# Patient Record
Sex: Male | Born: 1955 | Race: Black or African American | Hispanic: No | Marital: Single | Smoking: Never smoker
Health system: Southern US, Community
[De-identification: ages and names within clinical notes are randomized; demographics above are authoritative.]

## PROBLEM LIST (undated history)

## (undated) DIAGNOSIS — E119 Type 2 diabetes mellitus without complications: Secondary | ICD-10-CM

## (undated) DIAGNOSIS — I10 Essential (primary) hypertension: Secondary | ICD-10-CM

---

## 2015-07-29 ENCOUNTER — Encounter (HOSPITAL_BASED_OUTPATIENT_CLINIC_OR_DEPARTMENT_OTHER): Payer: Self-pay

## 2015-07-29 ENCOUNTER — Emergency Department (HOSPITAL_BASED_OUTPATIENT_CLINIC_OR_DEPARTMENT_OTHER): Payer: Medicare Other

## 2015-07-29 ENCOUNTER — Emergency Department (HOSPITAL_BASED_OUTPATIENT_CLINIC_OR_DEPARTMENT_OTHER)
Admission: EM | Admit: 2015-07-29 | Discharge: 2015-07-29 | Disposition: A | Discharge status: Home / Self Care | Payer: Medicare Other | Attending: Emergency Medicine | Admitting: Emergency Medicine

## 2015-07-29 DIAGNOSIS — R112 Nausea with vomiting, unspecified: Secondary | ICD-10-CM | POA: Diagnosis not present

## 2015-07-29 DIAGNOSIS — R1084 Generalized abdominal pain: Secondary | ICD-10-CM | POA: Diagnosis not present

## 2015-07-29 DIAGNOSIS — Z7982 Long term (current) use of aspirin: Secondary | ICD-10-CM | POA: Insufficient documentation

## 2015-07-29 DIAGNOSIS — Z79899 Other long term (current) drug therapy: Secondary | ICD-10-CM | POA: Diagnosis not present

## 2015-07-29 DIAGNOSIS — E119 Type 2 diabetes mellitus without complications: Secondary | ICD-10-CM | POA: Diagnosis not present

## 2015-07-29 DIAGNOSIS — R109 Unspecified abdominal pain: Secondary | ICD-10-CM | POA: Diagnosis present

## 2015-07-29 DIAGNOSIS — Z7902 Long term (current) use of antithrombotics/antiplatelets: Secondary | ICD-10-CM | POA: Insufficient documentation

## 2015-07-29 DIAGNOSIS — Z9889 Other specified postprocedural states: Secondary | ICD-10-CM | POA: Diagnosis not present

## 2015-07-29 DIAGNOSIS — Z7984 Long term (current) use of oral hypoglycemic drugs: Secondary | ICD-10-CM | POA: Insufficient documentation

## 2015-07-29 DIAGNOSIS — I10 Essential (primary) hypertension: Secondary | ICD-10-CM | POA: Insufficient documentation

## 2015-07-29 HISTORY — DX: Essential (primary) hypertension: I10

## 2015-07-29 HISTORY — DX: Type 2 diabetes mellitus without complications: E11.9

## 2015-07-29 LAB — CBC WITH DIFFERENTIAL/PLATELET
BASOS ABS: 0 10*3/uL (ref 0.0–0.1)
Basophils Relative: 0 %
EOS ABS: 0.2 10*3/uL (ref 0.0–0.7)
Eosinophils Relative: 3 %
HEMATOCRIT: 40.3 % (ref 39.0–52.0)
HEMOGLOBIN: 13.7 g/dL (ref 13.0–17.0)
LYMPHS PCT: 28 %
Lymphs Abs: 1.7 10*3/uL (ref 0.7–4.0)
MCH: 24.3 pg — ABNORMAL LOW (ref 26.0–34.0)
MCHC: 34 g/dL (ref 30.0–36.0)
MCV: 71.6 fL — ABNORMAL LOW (ref 78.0–100.0)
MONOS PCT: 8 %
Monocytes Absolute: 0.5 10*3/uL (ref 0.1–1.0)
NEUTROS ABS: 3.7 10*3/uL (ref 1.7–7.7)
NEUTROS PCT: 61 %
Platelets: 170 10*3/uL (ref 150–400)
RBC: 5.63 MIL/uL (ref 4.22–5.81)
RDW: 15.1 % (ref 11.5–15.5)
WBC: 6.1 10*3/uL (ref 4.0–10.5)

## 2015-07-29 LAB — COMPREHENSIVE METABOLIC PANEL
ALBUMIN: 4.2 g/dL (ref 3.5–5.0)
ALK PHOS: 38 U/L (ref 38–126)
ALT: 20 U/L (ref 17–63)
ANION GAP: 10 (ref 5–15)
AST: 21 U/L (ref 15–41)
BILIRUBIN TOTAL: 0.6 mg/dL (ref 0.3–1.2)
BUN: 22 mg/dL — ABNORMAL HIGH (ref 6–20)
CALCIUM: 9 mg/dL (ref 8.9–10.3)
CO2: 26 mmol/L (ref 22–32)
Chloride: 98 mmol/L — ABNORMAL LOW (ref 101–111)
Creatinine, Ser: 1.19 mg/dL (ref 0.61–1.24)
GFR calc non Af Amer: >60 mL/min (ref >60)
GLUCOSE: 320 mg/dL — AB (ref 65–99)
POTASSIUM: 4.6 mmol/L (ref 3.5–5.1)
Sodium: 134 mmol/L — ABNORMAL LOW (ref 135–145)
TOTAL PROTEIN: 8 g/dL (ref 6.5–8.1)

## 2015-07-29 LAB — URINE MICROSCOPIC-ADD ON

## 2015-07-29 LAB — URINALYSIS, ROUTINE W REFLEX MICROSCOPIC
BILIRUBIN URINE: NEGATIVE
Glucose, UA: >1000 mg/dL — AB
KETONES UR: NEGATIVE mg/dL
Leukocytes, UA: NEGATIVE
NITRITE: NEGATIVE
PH: 5.5 (ref 5.0–8.0)
Protein, ur: 30 mg/dL — AB
Specific Gravity, Urine: 1.025 (ref 1.005–1.030)

## 2015-07-29 LAB — LIPASE, BLOOD: LIPASE: 26 U/L (ref 11–51)

## 2015-07-29 LAB — CBG MONITORING, ED: GLUCOSE-CAPILLARY: 309 mg/dL — AB (ref 65–99)

## 2015-07-29 MED ORDER — IOHEXOL 300 MG/ML  SOLN
100.0000 mL | Freq: Once | INTRAMUSCULAR | Status: AC | PRN
  Administered 2015-07-29: 100 mL via INTRAVENOUS

## 2015-07-29 MED ORDER — ONDANSETRON 4 MG PO TBDP: ORAL_TABLET | ORAL | Status: DC

## 2015-07-29 MED ORDER — IOHEXOL 300 MG/ML  SOLN
25.0000 mL | Freq: Once | INTRAMUSCULAR | Status: AC | PRN
  Administered 2015-07-29: 25 mL via ORAL

## 2015-07-29 MED ORDER — SODIUM CHLORIDE 0.9 % IV BOLUS (SEPSIS)
500.0000 mL | Freq: Once | INTRAVENOUS | Status: AC
  Administered 2015-07-29: 500 mL via INTRAVENOUS

## 2015-07-29 MED ORDER — ONDANSETRON HCL 4 MG/2ML IJ SOLN
4.0000 mg | Freq: Once | INTRAMUSCULAR | Status: AC
  Administered 2015-07-29: 4 mg via INTRAVENOUS
  Filled 2015-07-29: qty 2

## 2015-07-29 MED ORDER — MORPHINE SULFATE (PF) 4 MG/ML IV SOLN
4.0000 mg | Freq: Once | INTRAVENOUS | Status: AC
  Administered 2015-07-29: 4 mg via INTRAVENOUS
  Filled 2015-07-29: qty 1

## 2015-07-29 NOTE — ED Notes (Signed)
Patient here with abdominal pain and vomiting that started lat pm, denies diarrhea. Reports general abdominal pain

## 2015-07-29 NOTE — ED Provider Notes (Signed)
CSN: [id number]     Arrival date & time 07/29/15  1018 History   First MD Initiated Contact with Patient 07/29/15 1030     Chief Complaint  Patient presents with  . Emesis     (Consider location/radiation/quality/duration/timing/severity/associated sxs/prior Treatment) HPI Comments: Patient presents with abdominal pain and vomiting. He has a history of diabetes and hypertension. He states that last night he started having pain to his midabdomen. Shortly after that he started having vomiting. He's had multiple episodes of nonbloody, nonbilious vomiting. He denies any change in bowel habits. His last bowel movement was 2 days ago which she states is normal for him. He denies any urinary symptoms. He denies any fevers or chills. He states the pain is been constant since it started. He's had a couple episodes in the past of similar pain which she states resolved without treatment. He has had an abdominal surgery but he doesn't know what the surgery was performed for. He states it was done when he had a pain similar to what he is having now. However he's not sure exactly what they did. He states he still has his gallbladder appendix and denies any colectomy.  Patient is a 60 y.o. male presenting with vomiting.  Emesis Associated symptoms: abdominal pain   Associated symptoms: no arthralgias, no chills, no diarrhea and no headaches     Past Medical History  Diagnosis Date  . Diabetes mellitus without complication (HCC)   . Hypertension    History reviewed. No pertinent past surgical history. No family history on file. Social History  Substance Use Topics  . Smoking status: Never Smoker   . Smokeless tobacco: None  . Alcohol Use: None    Review of Systems  Constitutional: Negative for fever, chills, diaphoresis and fatigue.  HENT: Negative for congestion, rhinorrhea and sneezing.   Eyes: Negative.   Respiratory: Negative for cough, chest tightness and shortness of breath.    Cardiovascular: Negative for chest pain and leg swelling.  Gastrointestinal: Positive for nausea, vomiting and abdominal pain. Negative for diarrhea and blood in stool.  Genitourinary: Negative for frequency, hematuria, flank pain and difficulty urinating.  Musculoskeletal: Negative for back pain and arthralgias.  Skin: Negative for rash.  Neurological: Negative for dizziness, speech difficulty, weakness, numbness and headaches.      Allergies  Review of patient's allergies indicates no known allergies.  Home Medications   Prior to Admission medications   Medication Sig Start Date End Date Taking? Authorizing Provider  aspirin EC 81 MG tablet Take 81 mg by mouth daily.   Yes Historical Provider, MD  carvedilol (COREG) 12.5 MG tablet Take 12.5 mg by mouth 2 (two) times daily with a meal.   Yes Historical Provider, MD  clopidogrel (PLAVIX) 75 MG tablet Take 75 mg by mouth daily.   Yes Historical Provider, MD  gabapentin (NEURONTIN) 300 MG capsule Take 300 mg by mouth 3 (three) times daily.   Yes Historical Provider, MD  hydrochlorothiazide (HYDRODIURIL) 25 MG tablet Take 25 mg by mouth daily.   Yes Historical Provider, MD  lisinopril (PRINIVIL,ZESTRIL) 40 MG tablet Take 40 mg by mouth daily.   Yes Historical Provider, MD  metFORMIN (GLUCOPHAGE) 1000 MG tablet Take 1,000 mg by mouth 2 (two) times daily with a meal.   Yes Historical Provider, MD  ondansetron (ZOFRAN ODT) 4 MG disintegrating tablet 4mg  ODT q4 hours prn nausea/vomit 07/29/15   Rolan Bucco, MD  pregabalin (LYRICA) 75 MG capsule Take 75 mg by mouth 2 (two)  times daily.   Yes Historical Provider, MD   BP 112/80 mmHg  Pulse 91  Temp(Src) 98.5 F (36.9 C) (Oral)  Resp 20  Ht  (1.676 m)  Wt 205 lb (92.987 kg)  BMI 33.10 kg/m2  SpO2 100% Physical Exam  Constitutional: He is oriented to person, place, and time. He appears well-developed and well-nourished.  HENT:  Head: Normocephalic and atraumatic.  Eyes: Pupils  are equal, round, and reactive to light.  Neck: Normal range of motion. Neck supple.  Cardiovascular: Normal rate, regular rhythm and normal heart sounds.   Pulmonary/Chest: Effort normal and breath sounds normal. No respiratory distress. He has no wheezes. He has no rales. He exhibits no tenderness.  Abdominal: Soft. Bowel sounds are normal. Tenderness: moderate TTP to periumbilical area.  midline vertical scar. There is no rebound and no guarding.  No definite hernia palpated  Musculoskeletal: Normal range of motion. He exhibits no edema.  Lymphadenopathy:    He has no cervical adenopathy.  Neurological: He is alert and oriented to person, place, and time.  Skin: Skin is warm and dry. No rash noted.  Psychiatric: He has a normal mood and affect.    ED Course  Procedures (including critical care time) Labs Review Labs Reviewed  URINALYSIS, ROUTINE W REFLEX MICROSCOPIC (NOT AT [facility]) - Abnormal; Notable for the following:    Glucose, UA >1000 (*)    Hgb urine dipstick TRACE (*)    Protein, ur 30 (*)    All other components within normal limits  COMPREHENSIVE METABOLIC PANEL - Abnormal; Notable for the following:    Sodium 134 (*)    Chloride 98 (*)    Glucose, Bld 320 (*)    BUN 22 (*)    All other components within normal limits  CBC WITH DIFFERENTIAL/PLATELET - Abnormal; Notable for the following:    MCV 71.6 (*)    MCH 24.3 (*)    All other components within normal limits  URINE MICROSCOPIC-ADD ON - Abnormal; Notable for the following:    Squamous Epithelial / LPF 0-5 (*)    Bacteria, UA RARE (*)    Casts HYALINE CASTS (*)    All other components within normal limits  CBG MONITORING, ED - Abnormal; Notable for the following:    Glucose-Capillary 309 (*)    All other components within normal limits  LIPASE, BLOOD    Imaging Review Ct Abdomen Pelvis W Contrast  07/29/2015  CLINICAL DATA:  Midline abdominal pain, vomiting for 2 days. EXAM: CT ABDOMEN AND PELVIS WITH  CONTRAST TECHNIQUE: Multidetector CT imaging of the abdomen and pelvis was performed using the standard protocol following bolus administration of intravenous contrast. CONTRAST:  OMNIPAQUE IOHEXOL 300 MG/ML SOLN, 25mL OMNIPAQUE IOHEXOL 300 MG/ML SOLN COMPARISON:  None. FINDINGS: Heart is borderline enlarged. No confluent opacities in the lung bases. No effusions. Liver, spleen, pancreas, adrenals are unremarkable. Small cysts in the left kidney. Right kidney unremarkable. No hydronephrosis. There is mild wall thickening within left abdominal small bowel loops. This is concerning for enteritis. No evidence of bowel obstruction. Free fluid noted around the liver and in the cul-de-sac. Prior hysterectomy. No adnexal masses. Urinary bladder unremarkable. Small bilateral inguinal and umbilical hernias. Appendix is visualized and is normal. There is fluid adjacent to the appendix in the right abdomen/ paracolic gutter. No acute bony abnormality or focal bone lesion. IMPRESSION: Mild wall thickening within left abdominal small bowel loops concerning for enteritis. Associated mild ascites around the liver and  in the cul-de-sac. Electronically Signed   By: Charlett Nose M.D.   On: 07/29/2015 13:35   I have personally reviewed and evaluated these images and lab results as part of my medical decision-making.   EKG Interpretation None      MDM   Final diagnoses:  Generalized abdominal pain    Patient presents with periumbilical pain and vomiting. He was given a dose of morphine and Zofran as well as IV fluids. His CT scan shows some signs of mild enteritis of the small bowel. There is no evidence of obstruction. No evidence of incarcerated hernias. Patient feels 100% better. He has absolutely no abdominal tenderness on exam. He's tolerating by mouth fluids. He was discharged home in good condition. He was advised to use a clear liquid diet for the next 24 hours. Is advised to return here for symptoms  worsen. He was also advised his blood sugar was elevated and that he needs to keep an eye on this at home.    Rolan Bucco, MD 07/29/15 [phone]

## 2015-07-29 NOTE — Discharge Instructions (Signed)

## 2015-07-29 NOTE — ED Notes (Signed)
Dr Belfi in room now. 

## 2015-08-02 ENCOUNTER — Emergency Department (HOSPITAL_BASED_OUTPATIENT_CLINIC_OR_DEPARTMENT_OTHER)
Admission: EM | Admit: 2015-08-02 | Discharge: 2015-08-02 | Disposition: A | Discharge status: Home / Self Care | Payer: Medicare Other | Attending: Emergency Medicine | Admitting: Emergency Medicine

## 2015-08-02 ENCOUNTER — Encounter (HOSPITAL_BASED_OUTPATIENT_CLINIC_OR_DEPARTMENT_OTHER): Payer: Self-pay

## 2015-08-02 DIAGNOSIS — M79622 Pain in left upper arm: Secondary | ICD-10-CM | POA: Diagnosis present

## 2015-08-02 DIAGNOSIS — M7981 Nontraumatic hematoma of soft tissue: Secondary | ICD-10-CM | POA: Diagnosis not present

## 2015-08-02 DIAGNOSIS — T148XXA Other injury of unspecified body region, initial encounter: Secondary | ICD-10-CM

## 2015-08-02 DIAGNOSIS — M79621 Pain in right upper arm: Secondary | ICD-10-CM | POA: Insufficient documentation

## 2015-08-02 DIAGNOSIS — Z7984 Long term (current) use of oral hypoglycemic drugs: Secondary | ICD-10-CM | POA: Diagnosis not present

## 2015-08-02 DIAGNOSIS — Z79899 Other long term (current) drug therapy: Secondary | ICD-10-CM | POA: Diagnosis not present

## 2015-08-02 DIAGNOSIS — Z7982 Long term (current) use of aspirin: Secondary | ICD-10-CM | POA: Insufficient documentation

## 2015-08-02 DIAGNOSIS — I10 Essential (primary) hypertension: Secondary | ICD-10-CM | POA: Diagnosis not present

## 2015-08-02 DIAGNOSIS — E119 Type 2 diabetes mellitus without complications: Secondary | ICD-10-CM | POA: Diagnosis not present

## 2015-08-02 NOTE — ED Provider Notes (Signed)
CSN: [id number]     Arrival date & time 08/02/15  1754 History  By signing my name below, I, Cristian Smith, attest that this documentation has been prepared under the direction and in the presence of Cristian Pili, PA-C. Electronically Signed: Phillis Smith, ED Scribe. 08/02/2015. 6:31 PM.   Chief Complaint  Patient presents with  . Arm Pain   The history is provided by the patient. No language interpreter was used.  HPI Comments: Cristian Smith is a 60 y.o. Male with a hx of HTN and DM who presents to the Emergency Department complaining of bruising to the bilateral upper arms onset 6 days ago. Pt states that he was seen on 07/29/15 and nurses attempted to stick IVs in the upper arms but were unable to get blood. He states that he "just wants to know why they look like that." He currently denies pain to the arms, fever, chills, cough, SOB, abdominal pain, nausea, vomiting, numbness, or weakness. He denies use of blood thinners. Pt states that he has been out of insulin for two weeks.   Past Medical History  Diagnosis Date  . Diabetes mellitus without complication (HCC)   . Hypertension    History reviewed. No pertinent past surgical history. No family history on file. Social History  Substance Use Topics  . Smoking status: Never Smoker   . Smokeless tobacco: None  . Alcohol Use: No    Review of Systems 10 Systems reviewed and all are negative for acute change except as noted in the HPI.  Allergies  Review of patient's allergies indicates no known allergies.  Home Medications   Prior to Admission medications   Medication Sig Start Date End Date Taking? Authorizing Provider  aspirin EC 81 MG tablet Take 81 mg by mouth daily.    Historical Provider, MD  carvedilol (COREG) 12.5 MG tablet Take 12.5 mg by mouth 2 (two) times daily with a meal.    Historical Provider, MD  clopidogrel (PLAVIX) 75 MG tablet Take 75 mg by mouth daily.    Historical Provider, MD  gabapentin (NEURONTIN) 300 MG  capsule Take 300 mg by mouth 3 (three) times daily.    Historical Provider, MD  hydrochlorothiazide (HYDRODIURIL) 25 MG tablet Take 25 mg by mouth daily.    Historical Provider, MD  lisinopril (PRINIVIL,ZESTRIL) 40 MG tablet Take 40 mg by mouth daily.    Historical Provider, MD  metFORMIN (GLUCOPHAGE) 1000 MG tablet Take 1,000 mg by mouth 2 (two) times daily with a meal.    Historical Provider, MD  ondansetron (ZOFRAN ODT) 4 MG disintegrating tablet  ODT q4 hours prn nausea/vomit 07/29/15   Rolan Bucco, MD  pregabalin (LYRICA) 75 MG capsule Take 75 mg by mouth 2 (two) times daily.    Historical Provider, MD   BP 122/90 mmHg  Pulse 94  Temp(Src) 99.2 F (37.3 C)  Resp 18  Wt 205 lb (92.987 kg)  SpO2 100% Physical Exam  Constitutional: He is oriented to person, place, and time. Vital signs are normal. He appears well-developed and well-nourished.  HENT:  Head: Normocephalic.  Right Ear: Hearing normal.  Left Ear: Hearing normal.  Eyes: Conjunctivae and EOM are normal. Pupils are equal, round, and reactive to light.  Neck: Normal range of motion. Neck supple.  Cardiovascular: Normal rate, regular rhythm and normal heart sounds.   Pulmonary/Chest: Effort normal and breath sounds normal.  Musculoskeletal:  Right Upper Extremity: - No atrophy - Skin: No abrasions, no lacerations, ecchymosis noted upper arm -  Motor: Full ROM at shoulder, elbow, wrist; 5/5 wrist flexion/extension, thumb, IP joint flexion/extension (AIN/PIN), abduction/adduction (ulnar)   - Sensation intact to median/ulnar/radial nerves - 2+ radial pulse, <2 sec cap refill x 5 digits  Left Upper Extremity: - No atrophy - Skin: No abrasions, no lacerations, ecchymosis noted upper arm - Motor: Full ROM at shoulder, elbow, wrist; 5/5 wrist flexion/extension, thumb, IP joint flexion/extension (AIN/PIN), abduction/adduction (ulnar)   - Sensation intact to median/ulnar/radial nerves - 2+ radial pulse, <2 sec cap refill x 5  digits  Neurological: He is alert and oriented to person, place, and time.  Skin: Skin is warm and dry.  Psychiatric: He has a normal mood and affect. His speech is normal and behavior is normal. Thought content normal.    ED Course  Procedures (including critical care time) DIAGNOSTIC STUDIES: Oxygen Saturation is 100% on RA, normal by my interpretation.    COORDINATION OF CARE: 6:30 PM-Discussed treatment plan which includes ibuprofen if symptoms worsen with pt at bedside and pt agreed to plan.   Labs Review Labs Reviewed - No data to display  Imaging Review No results found. I have personally reviewed and evaluated these images and lab results as part of my medical decision-making.   EKG Interpretation None      MDM  I have reviewed the relevant previous healthcare records. I obtained HPI from historian. Patient discussed with supervising physician  ED Course:  Assessment: 42y M presents with bilateral UE contusions from previous IV placement on 07-29-15. No pain. On exam, pt in NAD. VSS. Afebrile. Lungs CTA. Heart RRR. Contusions noted bilateral UE. No signs of infection. No surrounding erythema. Nontender to palpation. Will dc patient home with symptomatic treatment with heat and ice. Ibuprofen for pain. At time of discharge, Patient is in no acute distress. Vital Signs are stable. Patient is able to ambulate. Patient able to tolerate PO.   Disposition/Plan:  DC Home Additional Verbal discharge instructions given and discussed with patient.  Pt Instructed to f/u with PCP in the next week for further management  Return precautions given Pt acknowledges and agrees with plan  Supervising Physician Cristian Monday, MD   Final diagnoses:  Contusion   I personally performed the services described in this documentation, which was scribed in my presence. The recorded information has been reviewed and is accurate.    Cristian Pili, PA-C 08/02/15 Cristian Smith  Cristian Monday,  MD 08/03/15 [phone]

## 2015-08-02 NOTE — Discharge Instructions (Signed)
Please read and follow all provided instructions.  Your diagnoses today include:  1. Contusion    Tests performed today include:  Vital signs. See below for your results today.   Medications prescribed:   None  Home care instructions:  Follow any educational materials contained in this packet.  Follow-up instructions: Please follow-up with your primary care provider in the next week if symptoms persist    Return instructions:   Please return to the Emergency Department if you do not get better, if you get worse, or new symptoms OR  - Fever (temperature greater than 101.11F)  - Bleeding that does not stop with holding pressure to the area    -Severe pain (please note that you may be more sore the day after your accident)  - Chest Pain  - Difficulty breathing  - Severe nausea or vomiting  - Inability to tolerate food and liquids  - Passing out  - Skin becoming red around your wounds  - Change in mental status (confusion or lethargy)  - New numbness or weakness     Please return if you have any other emergent concerns.  Additional Information:  Your vital signs today were: BP 122/90 mmHg   Pulse 94   Temp(Src) 99.2 F (37.3 C)   Resp 18   Wt 92.987 kg   SpO2 100% If your blood pressure (BP) was elevated above 135/85 this visit, please have this repeated by your doctor within one month. ---------------

## 2015-08-02 NOTE — ED Notes (Signed)
Pt just seen here recently, had pivs placed in bilateral upper arms and having pain and bruising to site.

## 2018-04-12 ENCOUNTER — Emergency Department
Admission: EM | Admit: 2018-04-12 | Discharge: 2018-04-12 | Disposition: A | Discharge status: Home / Self Care | Payer: Medicare Other | Attending: Emergency Medicine | Admitting: Emergency Medicine

## 2018-04-12 ENCOUNTER — Emergency Department: Payer: Medicare Other

## 2018-04-12 ENCOUNTER — Encounter: Payer: Self-pay | Admitting: *Deleted

## 2018-04-12 ENCOUNTER — Other Ambulatory Visit: Payer: Self-pay

## 2018-04-12 DIAGNOSIS — Z7984 Long term (current) use of oral hypoglycemic drugs: Secondary | ICD-10-CM | POA: Diagnosis not present

## 2018-04-12 DIAGNOSIS — I10 Essential (primary) hypertension: Secondary | ICD-10-CM | POA: Insufficient documentation

## 2018-04-12 DIAGNOSIS — R531 Weakness: Secondary | ICD-10-CM | POA: Diagnosis present

## 2018-04-12 DIAGNOSIS — E119 Type 2 diabetes mellitus without complications: Secondary | ICD-10-CM | POA: Insufficient documentation

## 2018-04-12 DIAGNOSIS — Z7902 Long term (current) use of antithrombotics/antiplatelets: Secondary | ICD-10-CM | POA: Insufficient documentation

## 2018-04-12 DIAGNOSIS — Z79899 Other long term (current) drug therapy: Secondary | ICD-10-CM | POA: Diagnosis not present

## 2018-04-12 DIAGNOSIS — Z7982 Long term (current) use of aspirin: Secondary | ICD-10-CM | POA: Diagnosis not present

## 2018-04-12 LAB — CBC
HCT: 39.5 % (ref 39.0–52.0)
Hemoglobin: 12.4 g/dL — ABNORMAL LOW (ref 13.0–17.0)
MCH: 23.6 pg — ABNORMAL LOW (ref 26.0–34.0)
MCHC: 31.4 g/dL (ref 30.0–36.0)
MCV: 75.2 fL — ABNORMAL LOW (ref 80.0–100.0)
NRBC: 0 % (ref 0.0–0.2)
PLATELETS: 182 10*3/uL (ref 150–400)
RBC: 5.25 MIL/uL (ref 4.22–5.81)
RDW: 15.5 % (ref 11.5–15.5)
WBC: 6.2 10*3/uL (ref 4.0–10.5)

## 2018-04-12 LAB — URINALYSIS, COMPLETE (UACMP) WITH MICROSCOPIC
Bacteria, UA: NONE SEEN
Bilirubin Urine: NEGATIVE
GLUCOSE, UA: NEGATIVE mg/dL
KETONES UR: NEGATIVE mg/dL
LEUKOCYTES UA: NEGATIVE
Nitrite: NEGATIVE
PH: 7 (ref 5.0–8.0)
Protein, ur: 100 mg/dL — AB
Specific Gravity, Urine: 1.016 (ref 1.005–1.030)

## 2018-04-12 LAB — BASIC METABOLIC PANEL
Anion gap: 14 (ref 5–15)
BUN: 16 mg/dL (ref 6–20)
CALCIUM: 9.5 mg/dL (ref 8.9–10.3)
CO2: 23 mmol/L (ref 22–32)
CREATININE: 0.89 mg/dL (ref 0.61–1.24)
Chloride: 105 mmol/L (ref 98–111)
Glucose, Bld: 169 mg/dL — ABNORMAL HIGH (ref 70–99)
Potassium: 4.4 mmol/L (ref 3.5–5.1)
SODIUM: 142 mmol/L (ref 135–145)

## 2018-04-12 LAB — GLUCOSE, CAPILLARY
Glucose-Capillary: 149 mg/dL — ABNORMAL HIGH (ref 70–99)
Glucose-Capillary: 140 mg/dL — ABNORMAL HIGH (ref 70–99)

## 2018-04-12 LAB — TROPONIN I

## 2018-04-12 MED ORDER — LISINOPRIL 10 MG PO TABS
40.0000 mg | ORAL_TABLET | Freq: Every day | ORAL | Status: DC
  Administered 2018-04-12: 40 mg via ORAL
  Filled 2018-04-12: qty 4

## 2018-04-12 MED ORDER — CARVEDILOL 6.25 MG PO TABS: 12.5000 mg | ORAL_TABLET | Freq: Two times a day (BID) | ORAL | Status: DC

## 2018-04-12 MED ORDER — HYDROCHLOROTHIAZIDE 25 MG PO TABS
25.0000 mg | ORAL_TABLET | Freq: Every day | ORAL | Status: DC
  Administered 2018-04-12: 25 mg via ORAL
  Filled 2018-04-12: qty 1

## 2018-04-12 NOTE — ED Triage Notes (Addendum)
Pt to ED reporting increased weakness today HTN. Pt having headache with no new neuro deficits noted. Pt also verbalized fear that his glucose was elevated at took 30 units of insulin this morning. CBG with EMS was 228.   Speech is garbled at baseline per EMS.

## 2018-04-12 NOTE — ED Notes (Signed)
MD at bedside. 

## 2018-04-12 NOTE — ED Provider Notes (Addendum)
[facility] Emergency Department Provider Note  ____________________________________________   I have reviewed the triage vital signs and the nursing notes. Where available I have reviewed prior notes and, if possible and indicated, outside hospital notes.    HISTORY  Chief Complaint Weakness and Hypertension    HPI Cristian Smith is a 62 y.o. 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  He states that he felt a little bit weak for a few minutes as he was going through this anxiety provoking thought process, he denies any new numbness or weakness or difficulty speaking denies any chest pain shortness of breath nausea or vomiting.  He has not had much to eat today.  He states that he did not take his blood pressure medications last night either.  He has no headache although he had a slight headache for a few moments as well, no focal headache not the worst headache of life and the headache is completely gone.  It is not an atypical headache for him.  He did not mention the headache into the specifically asked that he said "yeah little".  Patient does have a history of dysarthria and states he is at his baseline and his ability to speak, at this time he would only like to drink some apple juice and watch TV he has no other complaints.    Past Medical History:  Diagnosis Date  . Diabetes mellitus without complication (HCC)   . Hypertension     There are no active problems to display for this patient.   History reviewed. No pertinent surgical history.  Prior to Admission medications   Medication Sig Start Date End Date  Taking? Authorizing Provider  aspirin EC 81 MG tablet Take 81 mg by mouth daily.    [provider]  carvedilol (COREG) 12.5 MG tablet Take 12.5 mg by mouth 2 (two) times daily with a meal.    [provider]  clopidogrel (PLAVIX) 75 MG tablet Take 75 mg by mouth daily.    [provider]  gabapentin (NEURONTIN) 300 MG capsule Take 300 mg by mouth 3 (three) times daily.    [provider]  hydrochlorothiazide (HYDRODIURIL) 25 MG tablet Take 25 mg by mouth daily.    [provider]  lisinopril (PRINIVIL,ZESTRIL) 40 MG tablet Take 40 mg by mouth daily.    [provider]  metFORMIN (GLUCOPHAGE) 1000 MG tablet Take 1,000 mg by mouth 2 (two) times daily with a meal.    [provider]  ondansetron (ZOFRAN ODT) 4 MG disintegrating tablet 4mg  ODT q4 hours prn nausea/vomit 07/29/15   Rolan Bucco, MD  pregabalin (LYRICA) 75 MG capsule Take 75 mg by mouth 2 (two) times daily.    [provider]    Allergies Patient has no known allergies.  History reviewed. No pertinent family history.  Social History Social History   Tobacco Use  . Smoking status: Never Smoker  . Smokeless tobacco: Never Used  Substance Use Topics  . Alcohol use: No  . Drug use: No    Review of Systems Constitutional: No fever/chills Eyes: No visual  changes. ENT: No sore throat. No stiff neck no neck pain Cardiovascular: Denies chest pain. Respiratory: Denies shortness of breath. Gastrointestinal:   no vomiting.  No diarrhea.  No constipation. Genitourinary: Negative for dysuria. Musculoskeletal: Negative lower extremity swelling Skin: Negative for rash. Neurological: Negative for severe headaches, focal weakness or numbness.   ____________________________________________   PHYSICAL EXAM:  VITAL SIGNS: ED Triage Vitals  Enc Vitals Group     BP 04/12/18 1700 (!) 191/106     Pulse Rate 04/12/18 1634 88     Resp 04/12/18 1634 17      Temp 04/12/18 1634 98.1 F (36.7 C)     Temp Source 04/12/18 1634 Oral     SpO2 04/12/18 1634 97 %     Weight 04/12/18 1632 200 lb 9 oz (91 kg)     Height 04/12/18 1632 5\' 7"  (1.702 m)     Head Circumference --      Peak Flow --      Pain Score 04/12/18 1632 8     Pain Loc --      Pain Edu? --      Excl. in GC? --     Constitutional: Alert and oriented. Well appearing and in no acute distress. Eyes: Conjunctivae are normal Head: Atraumatic HEENT: No congestion/rhinnorhea. Mucous membranes are moist.  Oropharynx non-erythematous Neck:   Nontender with no meningismus, no masses, no stridor Cardiovascular: Normal rate, regular rhythm. Grossly normal heart sounds.  Good peripheral circulation. Respiratory: Normal respiratory effort.  No retractions. Lungs CTAB. Abdominal: Soft and nontender. No distention. No guarding no rebound Back:  There is no focal tenderness or step off.  there is no midline tenderness there are no lesions noted. there is no CVA tenderness  Musculoskeletal: No lower extremity tenderness, no upper extremity tenderness. No joint effusions, no DVT signs strong distal pulses no edema Neurologic: Dysarthria noted which patient states is his baseline some right sided upper and lower extremity weakness noted which she also states this is baseline. Skin:  Skin is warm, dry and intact. No rash noted. Psychiatric: Mood and affect are normal. Speech and behavior are normal.  ____________________________________________   LABS (all labs ordered are listed, but only abnormal results are displayed)  Labs Reviewed  BASIC METABOLIC PANEL - Abnormal; Notable for the following components:      Result Value   Glucose, Bld 169 (*)    All other components within normal limits  CBC - Abnormal; Notable for the following components:   Hemoglobin 12.4 (*)    MCV 75.2 (*)    MCH 23.6 (*)    All other components within normal limits  GLUCOSE, CAPILLARY - Abnormal; Notable for the  following components:   Glucose-Capillary 149 (*)    All other components within normal limits  TROPONIN I  URINALYSIS, COMPLETE (UACMP) WITH MICROSCOPIC  CBG MONITORING, ED    Pertinent labs  results that were available during my care of the patient were reviewed by me and considered in my medical decision making (see chart for details). ____________________________________________  EKG  I personally interpreted any EKGs ordered by me or triage Sinus rhythm rate 80 bpm no acute ST elevation or depression normal axis unremarkable EKG ____________________________________________  RADIOLOGY  Pertinent labs & imaging results that were available during my care of the patient were reviewed by me and considered in my medical decision making (see chart for details). If possible, patient and/or family made aware of any abnormal findings.  No results found.  ____________________________________________    PROCEDURES  Procedure(s) performed: None  Procedures  Critical Care performed: None  ____________________________________________   INITIAL IMPRESSION / ASSESSMENT AND PLAN / ED COURSE  Pertinent labs & imaging results that were available during my care of the patient were reviewed by me and considered in my medical decision making (see chart for details).  Patient did not take any of his medications last night his blood pressure is little bit elevated, and he is having some concerns about his blood sugar being poorly regulated because he did not take his insulin, then he took his insulin in a typical time he is worried that it might drop.  He at this time is reassured by EMS his finding of his blood sugar and states he has no complaints he just wants to watch the path there is play football on TV, we will evaluate him for all of these complaints at this time there is no ongoing symptomology.  Patient likely should have his home blood pressure meds etc. agree given to him and we shall  do that.  I am not particularly concerned about the symptomatic elevated blood pressure in the context of noncompliance with medication however.  Difficult to determine whether there is been any new CVA symptoms so we will obtain a CT scan but low suspicion.  ----------------------------------------- 8:06 PM on 04/12/2018 -----------------------------------------  Patient laughing and joking in the room watching television, we have given him his home blood pressure medications, we will recheck blood pressure, he has absolutely no symptoms nor has had any since he is been here he is watching football on television and is requesting discharge.  He feels completely at his baseline, we will discharge him with extensive return precautions and follow-up given and understood.    ____________________________________________   FINAL CLINICAL IMPRESSION(S) / ED DIAGNOSES  Final diagnoses:  None      This chart was dictated using voice recognition software.  Despite best efforts to proofread,  errors can occur which can change meaning.      Jeanmarie Plant, MD 04/12/18 1826    Jeanmarie Plant, MD 04/12/18 2007

## 2018-04-12 NOTE — Discharge Instructions (Signed)
We have given you your night medications, please make sure that you take them as prescribed starting tomorrow.  If you have any new or worsening symptoms including headache, stiff neck, chest pain, shortness of breath, nausea, vomiting, change in your ability to speak walk or talk, or any other concerns including numbness or weakness return to the emergency room.

## 2018-04-12 NOTE — ED Notes (Signed)
PT given a meal tray.

## 2018-04-12 NOTE — ED Notes (Signed)
PT given apple juice 

## 2019-11-16 ENCOUNTER — Emergency Department: Payer: Medicare Other

## 2019-11-16 ENCOUNTER — Other Ambulatory Visit: Payer: Self-pay

## 2019-11-16 ENCOUNTER — Encounter: Payer: Self-pay | Admitting: Medical Oncology

## 2019-11-16 ENCOUNTER — Emergency Department
Admission: EM | Admit: 2019-11-16 | Discharge: 2019-11-16 | Disposition: A | Discharge status: Home / Self Care | Payer: Medicare Other | Attending: Emergency Medicine | Admitting: Emergency Medicine

## 2019-11-16 DIAGNOSIS — M79652 Pain in left thigh: Secondary | ICD-10-CM | POA: Insufficient documentation

## 2019-11-16 DIAGNOSIS — E1140 Type 2 diabetes mellitus with diabetic neuropathy, unspecified: Secondary | ICD-10-CM | POA: Diagnosis not present

## 2019-11-16 DIAGNOSIS — I10 Essential (primary) hypertension: Secondary | ICD-10-CM | POA: Diagnosis not present

## 2019-11-16 DIAGNOSIS — Z7902 Long term (current) use of antithrombotics/antiplatelets: Secondary | ICD-10-CM | POA: Diagnosis not present

## 2019-11-16 DIAGNOSIS — Z7984 Long term (current) use of oral hypoglycemic drugs: Secondary | ICD-10-CM | POA: Diagnosis not present

## 2019-11-16 DIAGNOSIS — M79605 Pain in left leg: Secondary | ICD-10-CM | POA: Insufficient documentation

## 2019-11-16 DIAGNOSIS — R52 Pain, unspecified: Secondary | ICD-10-CM

## 2019-11-16 DIAGNOSIS — Z79899 Other long term (current) drug therapy: Secondary | ICD-10-CM | POA: Insufficient documentation

## 2019-11-16 MED ORDER — TRAMADOL HCL 50 MG PO TABS
50.0000 mg | ORAL_TABLET | Freq: Once | ORAL | Status: AC
  Administered 2019-11-16: 50 mg via ORAL
  Filled 2019-11-16: qty 1

## 2019-11-16 MED ORDER — TRAMADOL HCL 50 MG PO TABS: 50.0000 mg | ORAL_TABLET | Freq: Two times a day (BID) | ORAL | 0 refills | Status: AC | PRN

## 2019-11-16 NOTE — ED Provider Notes (Addendum)
[facility] Emergency Department Provider Note   ____________________________________________   First MD Initiated Contact with Patient 11/16/19 [phone]     (approximate)  I have reviewed the triage vital signs and the nursing notes.   HISTORY  Chief Complaint Leg Pain    HPI Cristian Smith is a 64 y.o. male patient complaint 2 to 3 days of left leg pain.  Patient denies provocative or complaint.  Patient is a difficult to bear weight.  Patient denies chest pain or shortness of breath.  Patient rates pain as a 10/10.  Patient described the pain as "achy".  Pain increased with palpation of the area and weightbearing.  Patient is diabetic with peripheral neuropathy.  Patient state unable to sleep secondary to pain.         Past Medical History:  Diagnosis Date  . Diabetes mellitus without complication ([place])   . Hypertension     There are no problems to display for this patient.   History reviewed. No pertinent surgical history.  Prior to Admission medications   Medication Sig Start Date End Date Taking? Authorizing Provider  aspirin EC 81 MG tablet Take 81 mg by mouth daily.    [provider]  carvedilol (COREG) 12.5 MG tablet Take 12.5 mg by mouth 2 (two) times daily with a meal.    [provider]  clopidogrel (PLAVIX) 75 MG tablet Take 75 mg by mouth daily.    [provider]  gabapentin (NEURONTIN) 300 MG capsule Take 300 mg by mouth 3 (three) times daily.    [provider]  hydrochlorothiazide (HYDRODIURIL) 25 MG tablet Take 25 mg by mouth daily.    [provider]  lisinopril (PRINIVIL,ZESTRIL) 40 MG tablet Take 40 mg by mouth daily.    [provider]  metFORMIN (GLUCOPHAGE) 1000 MG tablet Take 1,000 mg by mouth 2 (two) times daily with a meal.    [provider]  pregabalin (LYRICA) 75 MG capsule Take 75 mg by mouth 2 (two) times daily.    [provider]  traMADol (ULTRAM)  50 MG tablet Take 1 tablet (50 mg total) by mouth every 12 (twelve) hours as needed for up to 5 days. 11/16/19 11/21/19  Sable Feil, PA-C    Allergies Patient has no known allergies.  No family history on file.  Social History Social History   Tobacco Use  . Smoking status: Never Smoker  . Smokeless tobacco: Never Used  Substance Use Topics  . Alcohol use: No  . Drug use: No    Review of Systems  Constitutional: No fever/chills Eyes: No visual changes. ENT: No sore throat. Cardiovascular: Denies chest pain. Respiratory: Denies shortness of breath. Gastrointestinal: No abdominal pain.  No nausea, no vomiting.  No diarrhea.  No constipation. Genitourinary: Negative for dysuria. Musculoskeletal: Left leg pain. Skin: Negative for rash. Neurological: Negative for headaches, focal weakness or numbness. Endocrine:  Chronic liver disease, diabetes, hyperlipidemia, and hypertension. ____________________________________________   PHYSICAL EXAM:  VITAL SIGNS: ED Triage Vitals  Enc Vitals Group     BP 11/16/19 0846 131/86     Pulse Rate 11/16/19 0846 93     Resp 11/16/19 0846 18     Temp 11/16/19 0846 98.6 F (37 C)     Temp Source 11/16/19 0846 Oral     SpO2 11/16/19 0846 98 %     Weight 11/16/19 0847 200 lb (90.7 kg)     Height 11/16/19 0847 5\' 7"  (1.702 m)  Head Circumference --      Peak Flow --      Pain Score 11/16/19 0851 10     Pain Loc --      Pain Edu? --      Excl. in GC? --    Constitutional: Alert and oriented. Well appearing and in no acute distress. Cardiovascular: Normal rate, regular rhythm. Grossly normal heart sounds.  Good peripheral circulation. Respiratory: Normal respiratory effort.  No retractions. Lungs CTAB. Gastrointestinal: Soft and nontender. No distention. No abdominal bruits. No CVA tenderness. Genitourinary: Deferred Musculoskeletal: No obvious deformity to the left lower leg.  Patient is moderate guarding palpation of the  anterior distal thigh to the left inguinal area. Neurologic:  Normal speech and language. No gross focal neurologic deficits are appreciated. No gait instability. Skin:  Skin is warm, dry and intact. No rash noted. Psychiatric: Mood and affect are normal. Speech and behavior are normal.  ____________________________________________   LABS (all labs ordered are listed, but only abnormal results are displayed)  Labs Reviewed - No data to display ____________________________________________  EKG   ____________________________________________  RADIOLOGY  ED MD interpretation:    Official radiology report(s): US Venous Img Lower Unilateral Left  Result Date: 11/16/2019 CLINICAL DATA:  Left lower extremity pain for the past 3 days. Evaluate for DVT. EXAM: LEFT LOWER EXTREMITY VENOUS DOPPLER ULTRASOUND TECHNIQUE: Gray-scale sonography with graded compression, as well as color Doppler and duplex ultrasound were performed to evaluate the lower extremity deep venous systems from the level of the common femoral vein and including the common femoral, femoral, profunda femoral, popliteal and calf veins including the posterior tibial, peroneal and gastrocnemius veins when visible. The superficial great saphenous vein was also interrogated. Spectral Doppler was utilized to evaluate flow at rest and with distal augmentation maneuvers in the common femoral, femoral and popliteal veins. COMPARISON:  None. FINDINGS: Contralateral Common Femoral Vein: Respiratory phasicity is normal and symmetric with the symptomatic side. No evidence of thrombus. Normal compressibility. Common Femoral Vein: No evidence of thrombus. Normal compressibility, respiratory phasicity and response to augmentation. Saphenofemoral Junction: No evidence of thrombus. Normal compressibility and flow on color Doppler imaging. Profunda Femoral Vein: No evidence of thrombus. Normal compressibility and flow on color Doppler imaging. Femoral  Vein: No evidence of thrombus. Normal compressibility, respiratory phasicity and response to augmentation. Popliteal Vein: No evidence of thrombus. Normal compressibility, respiratory phasicity and response to augmentation. Calf Veins: No evidence of thrombus. Normal compressibility and flow on color Doppler imaging. Superficial Great Saphenous Vein: No evidence of thrombus. Normal compressibility. Venous Reflux:  None. Other Findings:  None. IMPRESSION: No evidence of DVT within the left lower extremity. Electronically Signed   By: Simonne Come M.D.   On: 11/16/2019 10:41   DG FEMUR MIN 2 VIEWS LEFT  Result Date: 11/16/2019 CLINICAL DATA:  Left hip and knee pain for 2 days. EXAM: LEFT FEMUR 2 VIEWS COMPARISON:  None. FINDINGS: There is no fracture or focal bone lesion. Slight arthritic changes of the left hip and left knee. Arterial calcifications in the left thigh. No appreciable hip or knee effusions. IMPRESSION: No acute abnormality. Slight arthritic changes of the left hip and left knee. Electronically Signed   By: Francene Boyers M.D.   On: 11/16/2019 11:45    ____________________________________________   PROCEDURES  Procedure(s) performed (including Critical Care):  Procedures   ____________________________________________   INITIAL IMPRESSION / ASSESSMENT AND PLAN / ED COURSE  As part of my medical decision making, I reviewed the  following data within the electronic MEDICAL RECORD NUMBER     Patient presents with 2 days of left leg pain that he stated radiates from the knee to the left thigh.  Patient denies dyspnea or chest pain.  Discussed negative ultrasound and x-ray findings with patient.  Patient given discharge care instruction advised to follow-up PCP for definitive evaluation and treatment.    Kaniel Kiang was evaluated in Emergency Department on 11/16/2019 for the symptoms described in the history of present illness. He was evaluated in the context of the global COVID-19 pandemic,  which necessitated consideration that the patient might be at risk for infection with the SARS-CoV-2 virus that causes COVID-19. Institutional protocols and algorithms that pertain to the evaluation of patients at risk for COVID-19 are in a state of rapid change based on information released by regulatory bodies including the CDC and federal and state organizations. These policies and algorithms were followed during the patient's care in the ED.       ____________________________________________   FINAL CLINICAL IMPRESSION(S) / ED DIAGNOSES  Final diagnoses:  Left leg pain     ED Discharge Orders         Ordered    traMADol (ULTRAM) 50 MG tablet  Every 12 hours PRN     Discontinue  Reprint     11/16/19 1201           Note:  This document was prepared using Dragon voice recognition software and may include unintentional dictation errors.    Joni Reining, PA-C 11/16/19 1203    Joni Reining, PA-C 11/16/19 1203    Dionne Bucy, MD 11/16/19 1322

## 2019-11-16 NOTE — ED Notes (Signed)
See triage note   Presents with pain to left leg  Pain is mainly to knee and upper leg  Pain started couple of days ago  Pt is not a vary good historian

## 2019-11-16 NOTE — ED Triage Notes (Signed)
Pt reports for 2 days he has been having a pain that is in his left knee and radiates up his left thigh. PT reports it is difficult to bare weight on leg. Denies injury.

## 2019-11-16 NOTE — Discharge Instructions (Signed)
No acute findings on ultrasound or x-rays of the left lower extremity.  Take medication as needed for pain.

## 2020-06-13 ENCOUNTER — Other Ambulatory Visit: Payer: Self-pay

## 2020-06-13 ENCOUNTER — Emergency Department
Admission: EM | Admit: 2020-06-13 | Discharge: 2020-06-13 | Disposition: A | Discharge status: Home / Self Care | Payer: Medicare Other | Attending: Emergency Medicine | Admitting: Emergency Medicine

## 2020-06-13 ENCOUNTER — Encounter: Payer: Self-pay | Admitting: Emergency Medicine

## 2020-06-13 ENCOUNTER — Emergency Department: Payer: Medicare Other

## 2020-06-13 DIAGNOSIS — Z79899 Other long term (current) drug therapy: Secondary | ICD-10-CM | POA: Diagnosis not present

## 2020-06-13 DIAGNOSIS — W228XXA Striking against or struck by other objects, initial encounter: Secondary | ICD-10-CM | POA: Diagnosis not present

## 2020-06-13 DIAGNOSIS — Z7902 Long term (current) use of antithrombotics/antiplatelets: Secondary | ICD-10-CM | POA: Diagnosis not present

## 2020-06-13 DIAGNOSIS — E1159 Type 2 diabetes mellitus with other circulatory complications: Secondary | ICD-10-CM | POA: Insufficient documentation

## 2020-06-13 DIAGNOSIS — S299XXA Unspecified injury of thorax, initial encounter: Secondary | ICD-10-CM | POA: Diagnosis present

## 2020-06-13 DIAGNOSIS — Z7984 Long term (current) use of oral hypoglycemic drugs: Secondary | ICD-10-CM | POA: Diagnosis not present

## 2020-06-13 DIAGNOSIS — S2241XA Multiple fractures of ribs, right side, initial encounter for closed fracture: Secondary | ICD-10-CM | POA: Insufficient documentation

## 2020-06-13 DIAGNOSIS — Y9389 Activity, other specified: Secondary | ICD-10-CM | POA: Insufficient documentation

## 2020-06-13 DIAGNOSIS — I10 Essential (primary) hypertension: Secondary | ICD-10-CM | POA: Diagnosis not present

## 2020-06-13 DIAGNOSIS — Z7982 Long term (current) use of aspirin: Secondary | ICD-10-CM | POA: Insufficient documentation

## 2020-06-13 MED ORDER — OXYCODONE HCL 5 MG PO TABS: 5.0000 mg | ORAL_TABLET | Freq: Three times a day (TID) | ORAL | 0 refills | Status: AC | PRN

## 2020-06-13 NOTE — ED Notes (Signed)
Pt given cab voucher to get home per charge.
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
## 2020-06-13 NOTE — Discharge Instructions (Signed)
Please follow up with your primary care doctor in a week.  Use the incentive spirometer every 2-3 hours.  Return to the ER for symptoms of concern if unable to see primary care.
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
## 2020-06-14 ENCOUNTER — Other Ambulatory Visit: Payer: Self-pay

## 2020-06-14 ENCOUNTER — Encounter: Payer: Self-pay | Admitting: Emergency Medicine

## 2020-06-14 ENCOUNTER — Emergency Department: Payer: Medicare Other

## 2020-06-14 ENCOUNTER — Emergency Department
Admission: EM | Admit: 2020-06-14 | Discharge: 2020-06-14 | Disposition: A | Discharge status: Home / Self Care | Payer: Medicare Other | Attending: Emergency Medicine | Admitting: Emergency Medicine

## 2020-06-14 DIAGNOSIS — Z7984 Long term (current) use of oral hypoglycemic drugs: Secondary | ICD-10-CM | POA: Diagnosis not present

## 2020-06-14 DIAGNOSIS — S2241XD Multiple fractures of ribs, right side, subsequent encounter for fracture with routine healing: Secondary | ICD-10-CM | POA: Diagnosis not present

## 2020-06-14 DIAGNOSIS — W01198D Fall on same level from slipping, tripping and stumbling with subsequent striking against other object, subsequent encounter: Secondary | ICD-10-CM | POA: Diagnosis not present

## 2020-06-14 DIAGNOSIS — I10 Essential (primary) hypertension: Secondary | ICD-10-CM | POA: Diagnosis not present

## 2020-06-14 DIAGNOSIS — M79661 Pain in right lower leg: Secondary | ICD-10-CM | POA: Insufficient documentation

## 2020-06-14 DIAGNOSIS — M79604 Pain in right leg: Secondary | ICD-10-CM

## 2020-06-14 DIAGNOSIS — Z79899 Other long term (current) drug therapy: Secondary | ICD-10-CM | POA: Diagnosis not present

## 2020-06-14 DIAGNOSIS — Y92512 Supermarket, store or market as the place of occurrence of the external cause: Secondary | ICD-10-CM | POA: Insufficient documentation

## 2020-06-14 MED ORDER — ACETAMINOPHEN 500 MG PO TABS: 1000.0000 mg | ORAL_TABLET | Freq: Three times a day (TID) | ORAL | 0 refills | Status: AC | PRN

## 2020-06-14 NOTE — ED Notes (Signed)
Pt returned from X-ray at this time, resting in bed with NAD noted at this time.
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
## 2020-06-14 NOTE — ED Triage Notes (Signed)
Pt o ED via [facility] with c/o R knee pain, R rib pain and back pain. Pt seen yesterday and prescribed oxycodone for pain after being evaluated for symptoms, EMS reports pt has filled prescriptions and has taken 3 doses without relief of pain.
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
## 2020-06-14 NOTE — Discharge Instructions (Signed)
Your leg x-ray does not show any bone injuries.  Your pain appears to be due to muscle strain and spasm.  You can continue to take the oxycodone prescribed yesterday, and add Tylenol as well.  Try to continue light activity such as walking to help loosen up your muscles.

## 2020-11-08 IMAGING — US US EXTREM LOW VENOUS*L*
1 series · 15 of 24 positions shown · non-contrast
Comparison: None.

CLINICAL DATA: Left lower extremity pain for the past 3 days.
Evaluate for DVT.



[Series 1: us extrem low venous*left* · 15 of 32 slices shown]
[im 1/32]
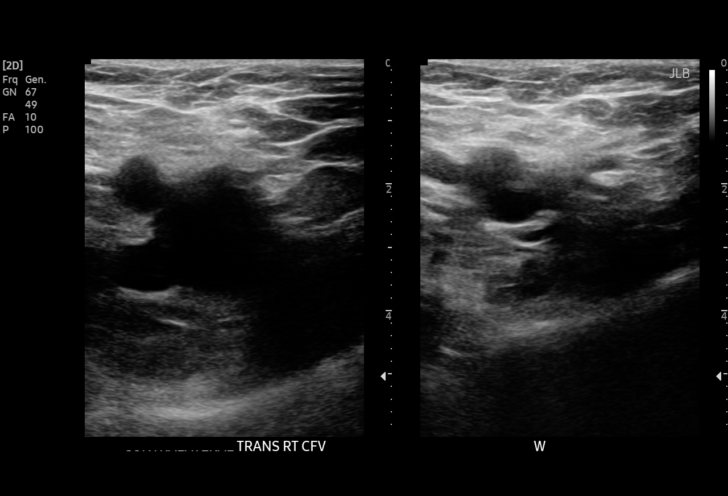
[im 3/32]
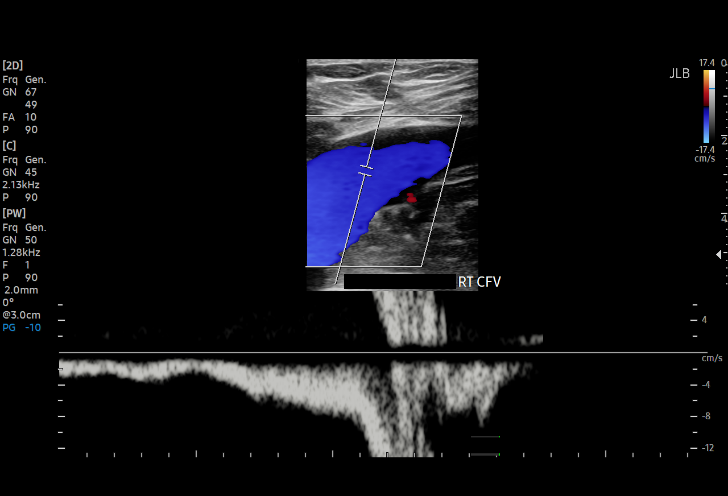
[im 6/32]
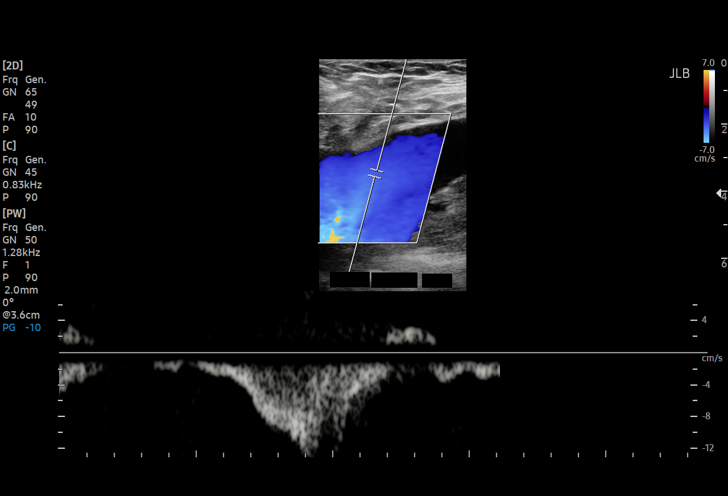
[im 7/32]
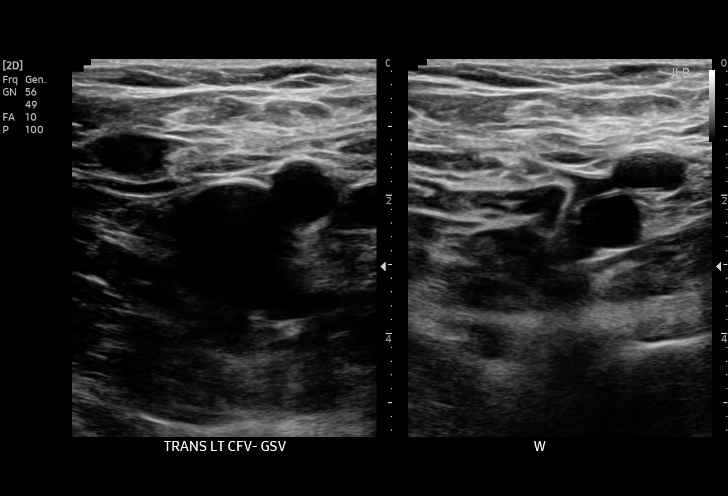
[im 10/32]
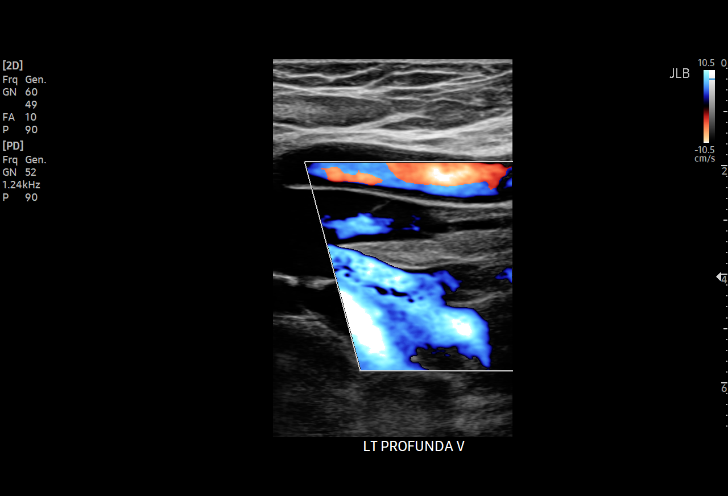
[im 11/32]
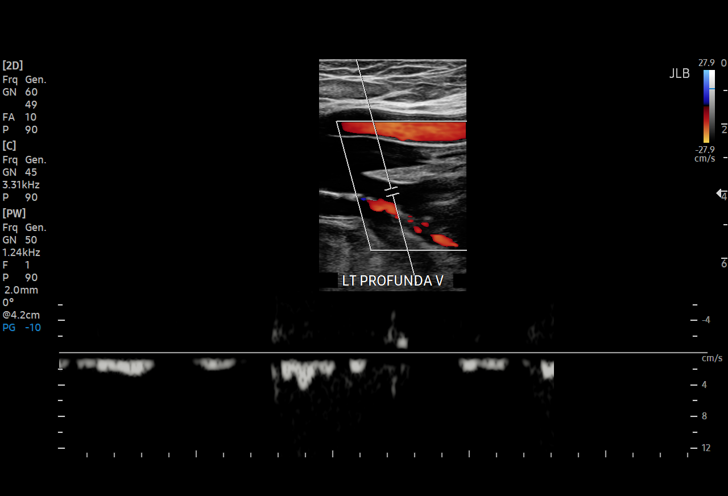
[im 14/32]
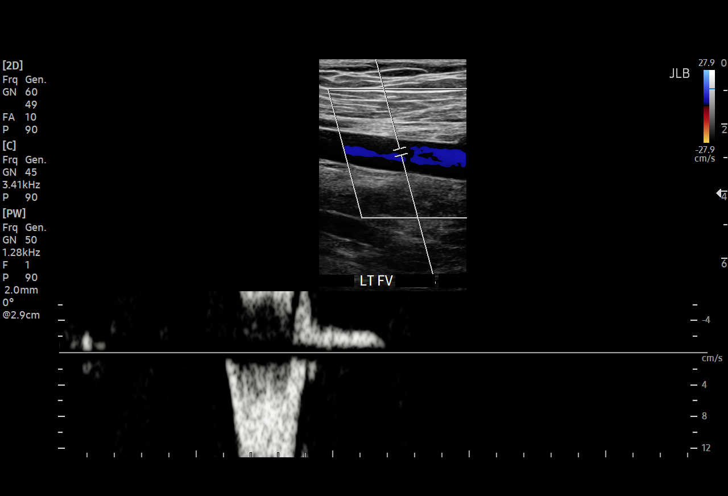
[im 17/32]
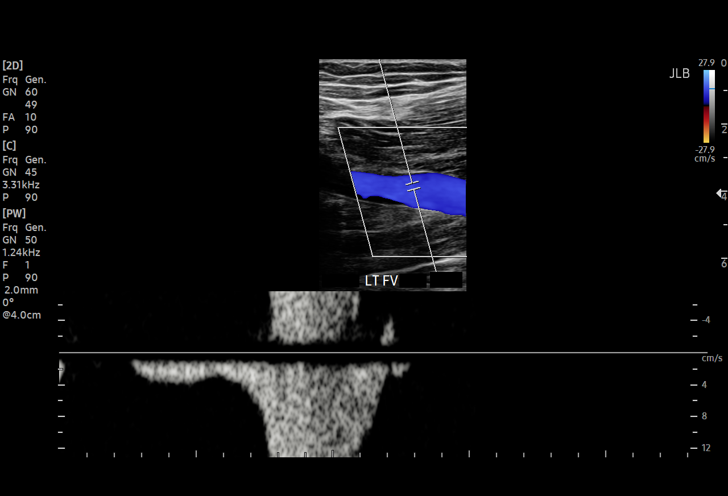
[im 18/32]
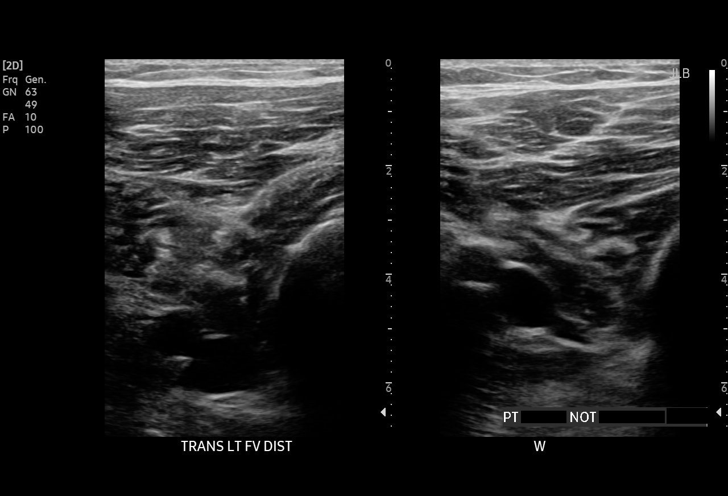
[im 21/32]
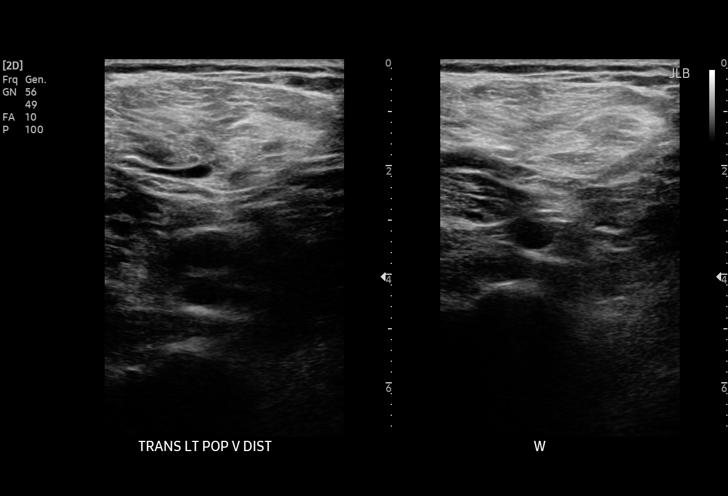
[im 22/32]
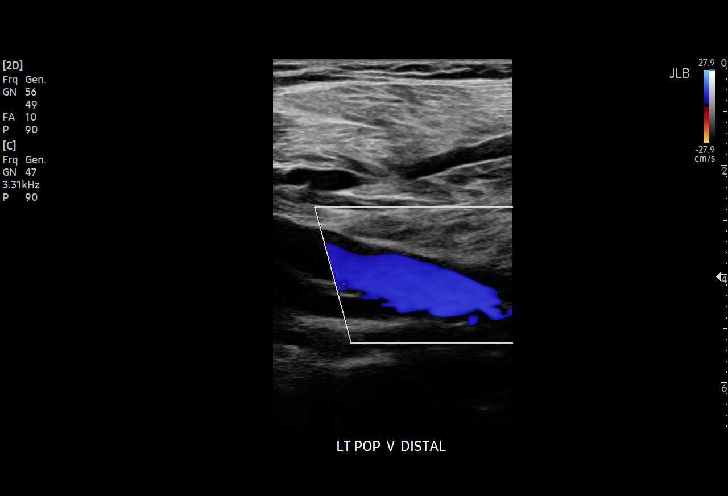
[im 25/32]
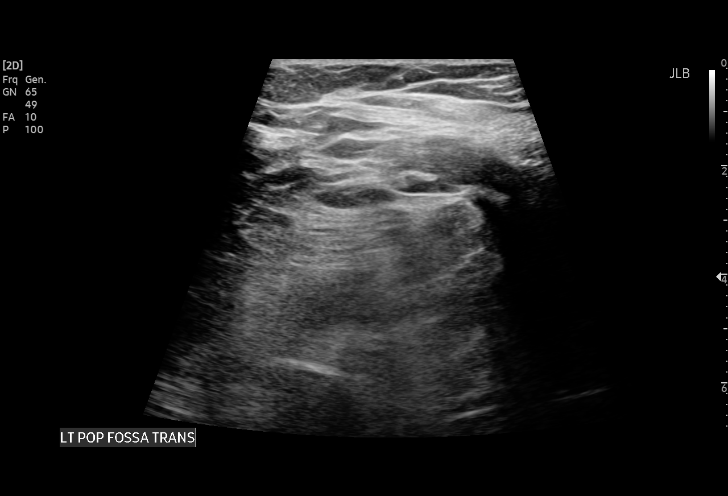
[im 27/32]
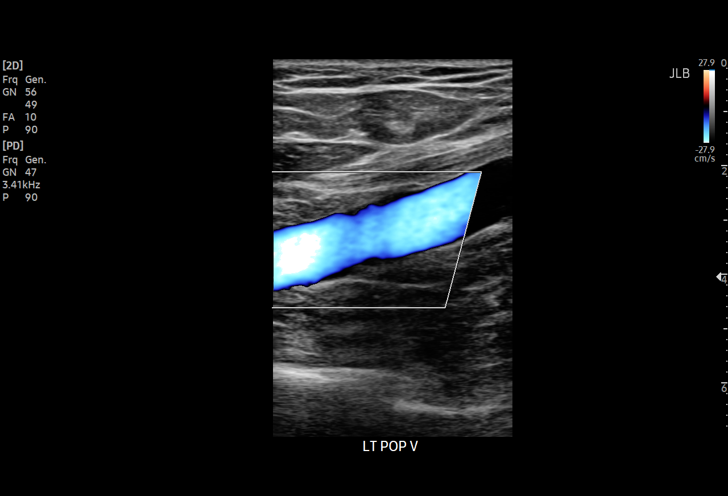
[im 29/32]
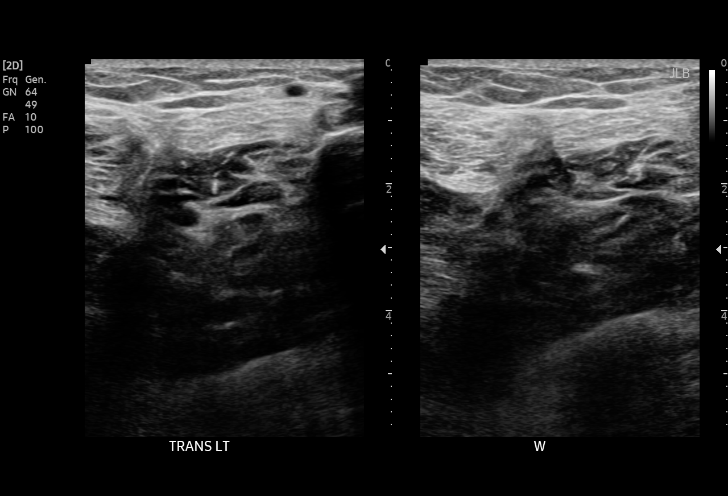
[im 32/32]
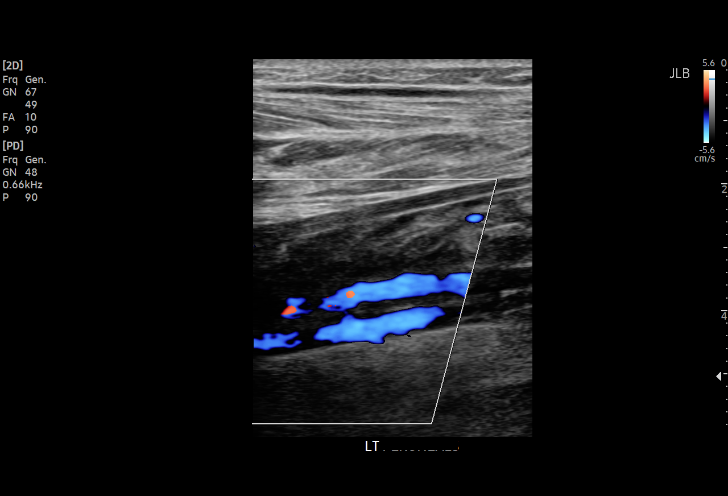

[15 of 24 positions shown; findings below may reference images not displayed]

FINDINGS: Contralateral Common Femoral Vein: Respiratory phasicity is normal
and symmetric with the symptomatic side. No evidence of thrombus.
Normal compressibility.

Common Femoral Vein: No evidence of thrombus. Normal
compressibility, respiratory phasicity and response to augmentation.

Saphenofemoral Junction: No evidence of thrombus. Normal
compressibility and flow on color Doppler imaging.

Profunda Femoral Vein: No evidence of thrombus. Normal
compressibility and flow on color Doppler imaging.

Femoral Vein: No evidence of thrombus. Normal compressibility,
respiratory phasicity and response to augmentation.

Popliteal Vein: No evidence of thrombus. Normal compressibility,
respiratory phasicity and response to augmentation.

Calf Veins: No evidence of thrombus. Normal compressibility and flow
on color Doppler imaging.

Superficial Great Saphenous Vein: No evidence of thrombus. Normal
compressibility.

Venous Reflux:  None.

Other Findings:  None.
IMPRESSION: No evidence of DVT within the left lower extremity.

## 2021-06-06 IMAGING — CR DG RIBS W/ CHEST 3+V*R*
1 series · 3 of 3 positions shown · non-contrast
Comparison: No prior.

CLINICAL DATA: Pain after hitting ribs.

EXAM:
RIGHT RIBS AND CHEST - 3+ VIEW

[Series 1: dg ribs unilateral w/chest right · 0.14mm/px · 3 of 3 slices shown]
[im 1/3]
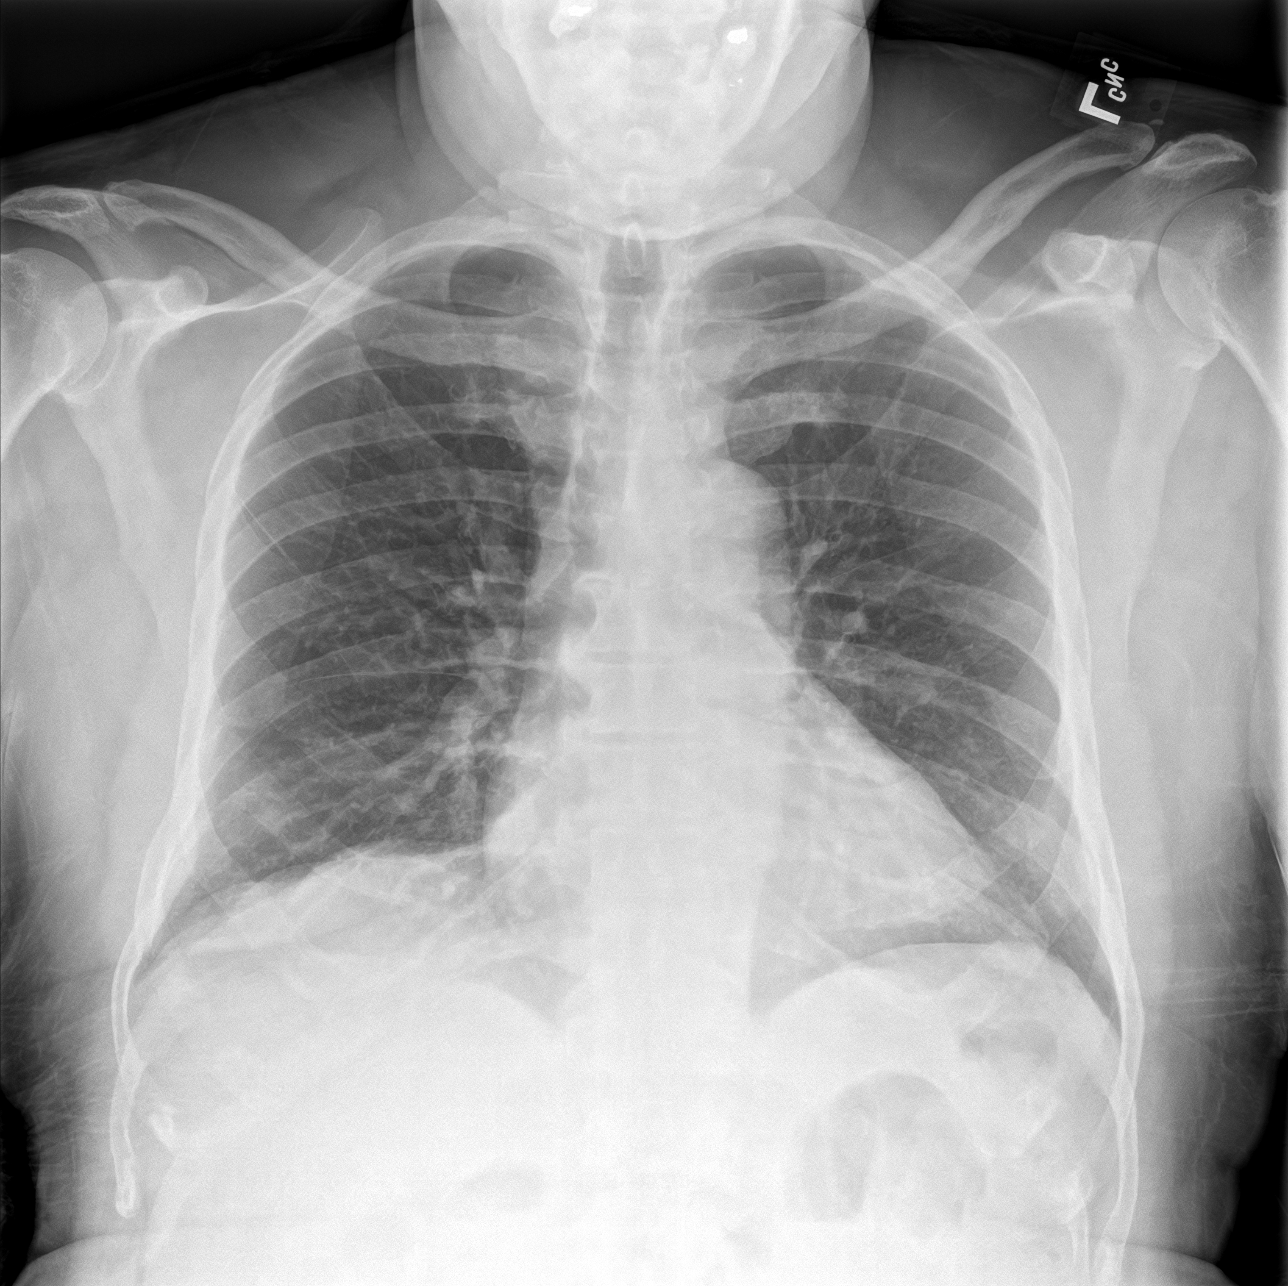
[im 2/3]
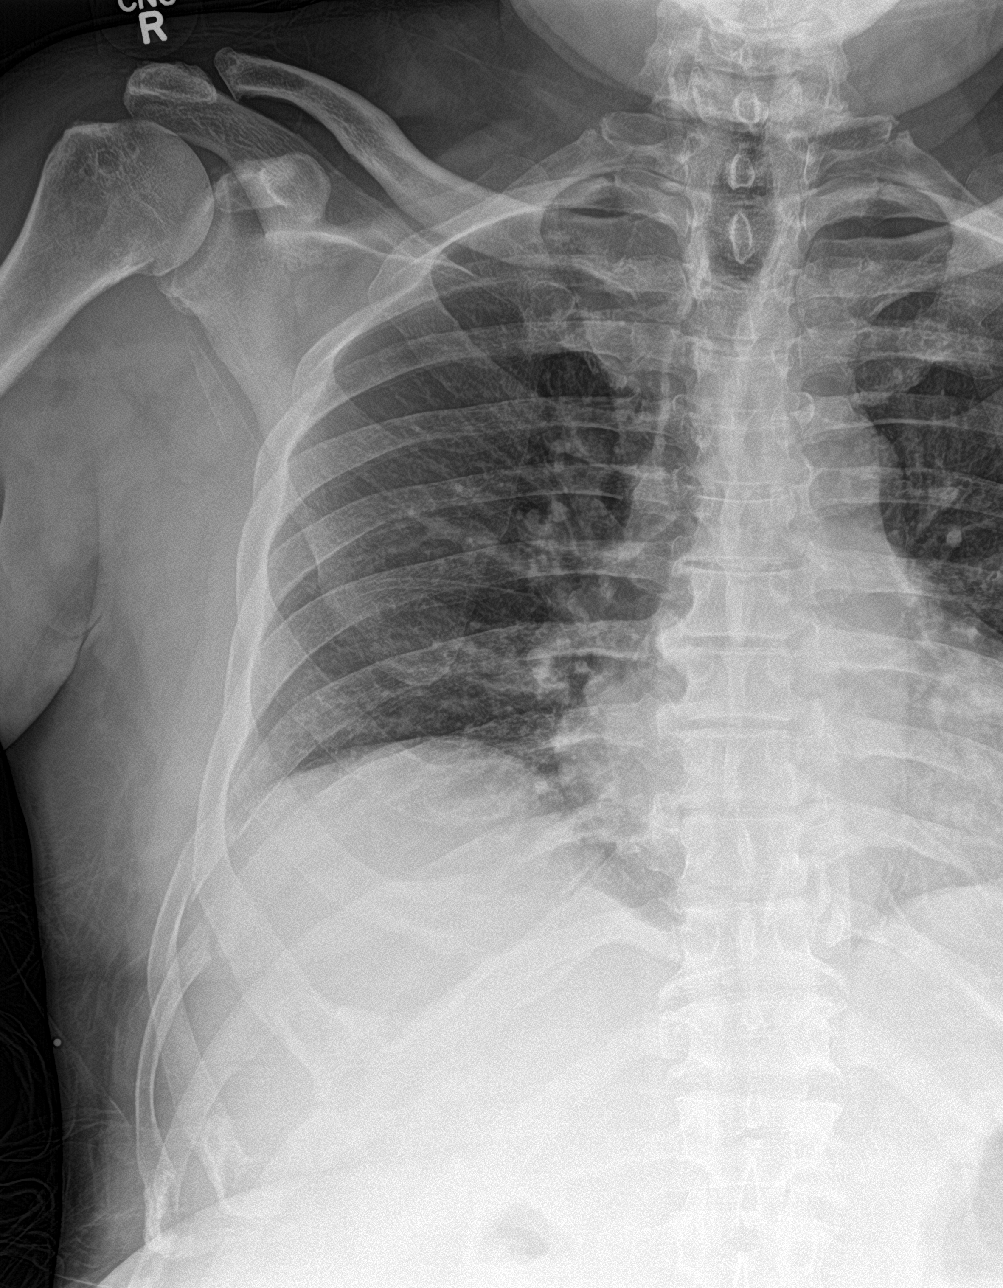
[im 3/3]
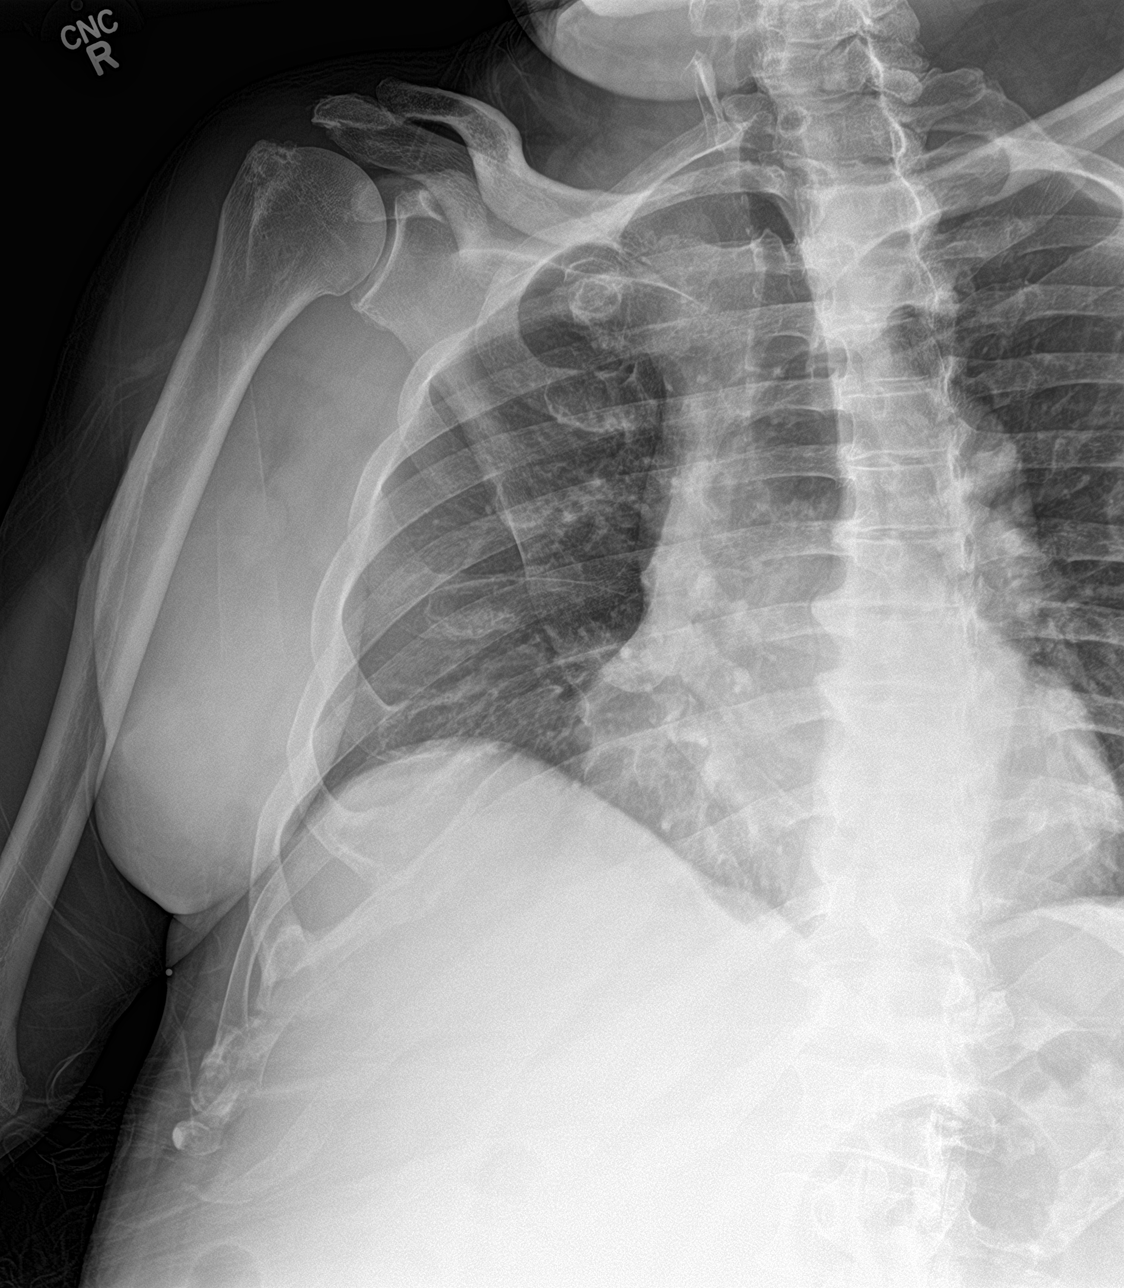

[3 of 3 positions shown; findings below may reference images not displayed]

FINDINGS: Mediastinum hilar structures normal. Low lung volumes. Lungs are
clear. No pleural effusion or pneumothorax. Left posterolateral
eighth and ninth rib fractures noted. Possible right posterolateral
tenth rib fracture.
IMPRESSION: 1. Left posterolateral eighth and ninth rib fractures noted.
Possible right posterolateral tenth rib fracture. No pneumothorax.
2. Low lung volumes. No acute cardiopulmonary disease.
